# Patient Record
Sex: Female | Born: 1990 | Race: Black or African American | Hispanic: No | Marital: Single | Smoking: Never smoker
Health system: Southern US, Community
[De-identification: ages and names within clinical notes are randomized; demographics above are authoritative.]

## PROBLEM LIST (undated history)

## (undated) DIAGNOSIS — L309 Dermatitis, unspecified: Secondary | ICD-10-CM

## (undated) DIAGNOSIS — N3010 Interstitial cystitis (chronic) without hematuria: Secondary | ICD-10-CM

## (undated) HISTORY — DX: Interstitial cystitis (chronic) without hematuria: N30.10

## (undated) HISTORY — DX: Dermatitis, unspecified: L30.9

---
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
## 2009-04-05 ENCOUNTER — Ambulatory Visit: Payer: Self-pay | Admitting: Internal Medicine

## 2009-11-09 ENCOUNTER — Telehealth: Payer: Self-pay | Admitting: Internal Medicine

## 2010-05-14 NOTE — Progress Notes (Signed)
Summary: work in [employer] from Patient Call back at [phone]   Caller: Mom----walk in Summary of Call: needs cpx between 11-28-2009 and 11-29-2009 Initial call taken by: Warnell Forester,  November 09, 2009 5:01 PM  Follow-up for Phone Call        Okay to work in on 8/17 put in am. Can use [employer].  Follow-up by: Romualdo Bolk, CMA Duncan Dull),  November 09, 2009 5:17 PM  Additional Follow-up for Phone Call Additional follow up Details #1::        lmoam for mom to call back..made appt for 11-28-2009 at 11:15am for pt. Additional Follow-up by: Warnell Forester,  November 12, 2009 8:05 AM    Additional Follow-up for Phone Call Additional follow up Details #2::    lmoam-mom to return call to confirm appt. Follow-up by: Warnell Forester,  November 13, 2009 3:50 PM  Additional Follow-up for Phone Call Additional follow up Details #3:: Details for Additional Follow-up Action Taken: Mom called back to confirm appt.... adv her daughter will come in for cpx with Dr Fabian Sharp on 8/17 @ 11:15am.  Additional Follow-up by: Debbra Riding,  November 20, 2009 1:41 PM

## 2010-08-30 NOTE — Op Note (Signed)
Katelyn Hunter, Katelyn Hunter             ACCOUNT NO.:  [id number]   MEDICAL RECORD NO.:  [id number]          PATIENT TYPE:  AMB   LOCATION:  [facility]                         FACILITY:  [facility]   PHYSICIAN:  Maretta Bees. Vonita Moss, M.D.DATE OF BIRTH:  Apr 22, 1990   DATE OF PROCEDURE:  06/02/2005  DATE OF DISCHARGE:                                 OPERATIVE REPORT   PREOPERATIVE DIAGNOSIS:  Rule out interstitial cystitis.   POSTOPERATIVE DIAGNOSES:  1.  Rule out interstitial cystitis.  2.  Urethral stenosis.   PROCEDURE:  Cystoscopy, urethral dilation, hydraulic overdistention of  bladder and cold cup bladder biopsies.   SURGEON:  Dr. Larey Dresser.   ANESTHESIA:  General.   INDICATIONS:  This 20 year old lady has had very frequent urination and  lower abdominal and back pain over the last several weeks. She has had a CT  scan and a gynecologic workup. I was concerned about the diagnosis of IC.   DESCRIPTION OF PROCEDURE:  The patient was brought to the operating room,  placed in lithotomy position, external genitalia were prepped and draped in  the usual fashion. She was cystoscoped, but only after her urethra was  dilated to 26-French since the cystoscope did not initially pass easily. The  bladder was totally unremarkable and using the 70 degree lens and filling  her with sterile water at 300 mL and on the anterior wall the bladder mucosa  split. Therefore at 300 mL I stopped filling and emptied the bladder and  then looked back in and she had widespread submucosal petechiae and  hemorrhage in all four quadrants of the bladder typical of IC. Cold cup  bladder biopsies were obtained from the base in typical hemorrhagic areas  and the biopsy sites were fulgurated. I suspected she was  going to be uncomfortable afterwards so I inserted a combination of  Pyridium, Kenalog and Marcaine into the bladder. She was also given 15 mg of  Toradol IV. She was taken to the recovery room in good condition  having  tolerated the procedure well.      Maretta Bees. Vonita Moss, M.D.  Electronically Signed     LJP/MEDQ  D:  06/02/2005  T:  06/03/2005  Job:  [id number]   cc:   Neta Mends. Fabian Sharp, M.D. [facility]  [address] [place]  [place] [address]   M. Leda Quail, MD

## 2010-09-11 ENCOUNTER — Encounter: Payer: Self-pay | Admitting: Internal Medicine

## 2010-09-12 ENCOUNTER — Ambulatory Visit (INDEPENDENT_AMBULATORY_CARE_PROVIDER_SITE_OTHER): Payer: 59 | Admitting: Internal Medicine

## 2010-09-12 ENCOUNTER — Encounter: Payer: Self-pay | Admitting: Internal Medicine

## 2010-09-12 VITALS — BP 110/60 | HR 66 | Temp 99.0°F | Wt 121.0 lb

## 2010-09-12 DIAGNOSIS — Z464 Encounter for fitting and adjustment of orthodontic device: Secondary | ICD-10-CM

## 2010-09-12 DIAGNOSIS — H60399 Other infective otitis externa, unspecified ear: Secondary | ICD-10-CM

## 2010-09-12 DIAGNOSIS — R6884 Jaw pain: Secondary | ICD-10-CM | POA: Insufficient documentation

## 2010-09-12 DIAGNOSIS — H6090 Unspecified otitis externa, unspecified ear: Secondary | ICD-10-CM

## 2010-09-12 DIAGNOSIS — L259 Unspecified contact dermatitis, unspecified cause: Secondary | ICD-10-CM

## 2010-09-12 MED ORDER — KETOCONAZOLE 2 % EX CREA: TOPICAL_CREAM | Freq: Every day | CUTANEOUS | Status: DC

## 2010-09-12 MED ORDER — OFLOXACIN 0.3 % OT SOLN: 10.0000 [drp] | Freq: Every day | OTIC | Status: AC

## 2010-09-12 NOTE — Patient Instructions (Signed)
Use ear drops for external otitis. Also aleve 2 2 x per day for about a week or so or as needed for pain Expect you to feel better within a week . Call for advise if not getting  Better.

## 2010-09-12 NOTE — Progress Notes (Signed)
  Subjective:    Patient ID: Katelyn Hunter, female    DOB: [birth year], 20 y.o.   MRN: [id number]  HPI  Patient comes in today back from college .No major change in health status since last visit . Visit was over a year ago . Since than no major changes in health ..Using creams for skin rashes but doing ok.   Having headaches on the left and sore  Near ear like water in her ear and feels like jaw pain worse with touching the outside ear and  talking and eating pain.   Onset for 4-5 days . No fever  no history of swimming or water in the ear or Q-tips. Aleve some helps some but still hurts. No cold sx.  Some mild allergy sx.  No meds .  Ran out of skin cream left upper DC that she uses as needed for rashes and itching that seems to help. This appears to be the ketoconazole. She has used hydrocortisone also.  Last visit she's been generally well with no major injuries. No major changes in health. In school  [employer]. [employer]. Rising senior   Taking L sat  or sudden loss goal.   Review of Systems No fever. Has braces second time no recent manipulations no teeth grinding injury to the face or jaw.as per history of present illness  Past Medical History  Diagnosis Date  . Interstitial cystitis   . Allergic rhinitis   . Eczema    No past surgical history on file.  reports that she has never smoked. She does not have any smokeless tobacco history on file. Her alcohol and drug histories not on file. family history includes Depression in her father. No Known Allergies     Objective:   Physical Exam Well-developed well-nourished in no acute distress HEENT: Normocephalic ;atraumatic , Eyes;  PERRL, EOMs  Full, lids and conjunctiva clear,,Ears: no deformities, canals right nl left with white wet dc tm intact  Ead very tender but patent and minimal redness  Tender at tragus and alos at tmj area , TM landmarks normal, Nose: no deformity or discharge  Mouth : OP clear without  lesion or edema . Wearing braces Neck supple without masses Some tenderness at the left a.c. Area. Chest CTA BX equal Cardiac no gallops or murmurs regular rhythm  Skin diffuse blotchy areas on the arms face is clear Neuro grossly intact  Gait normal  Oriented x 3 and no noted deficits in memory, attention, and speech.       Assessment & Plan:  Left ear pain external otitis unclear cause. Discussed treatment antibiotic ear drops Aleve for pain She may also have an element of TMJ pain discussed treatment for that with relative tall rest and anti-inflammatories.  Skin: Unsure why the ketoconazole is working as needed for occasional rashes. She left her prescription up at [facility] we'll refill it locally. She'll be due for a checkup at the end of the summer she can call for an appointment and we'll work her in her school schedule.   Total visit > 50% spent counseling and coordinating care

## 2011-01-09 ENCOUNTER — Telehealth: Payer: Self-pay | Admitting: Internal Medicine

## 2011-01-09 NOTE — Telephone Encounter (Addendum)
Pt requesting refill on ketoconazole (NIZORAL) 2 % cream  Pt needs [facility] a [facility] in [place] but mother could not give me exact location or number. Please contact pt. please contact pt  At [phone]

## 2011-01-10 MED ORDER — KETOCONAZOLE 2 % EX CREA: TOPICAL_CREAM | Freq: Every day | CUTANEOUS | Status: DC

## 2011-01-10 NOTE — Telephone Encounter (Signed)
Left message for pt to call back  °

## 2011-11-17 ENCOUNTER — Other Ambulatory Visit: Payer: Self-pay | Admitting: Family Medicine

## 2011-11-17 ENCOUNTER — Other Ambulatory Visit: Payer: 59

## 2011-11-17 ENCOUNTER — Telehealth: Payer: Self-pay | Admitting: Internal Medicine

## 2011-11-17 DIAGNOSIS — Z0000 Encounter for general adult medical examination without abnormal findings: Secondary | ICD-10-CM

## 2011-11-17 NOTE — Telephone Encounter (Signed)
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 Pls advise where to sch.

## 2011-11-17 NOTE — Telephone Encounter (Signed)
Pt scheduled 11/19/11 @ 12:15 per WP.  Mom notified.  Will put in labs.  Pt will have lab work today.  Pryor Curia will work her in.

## 2011-11-18 ENCOUNTER — Other Ambulatory Visit: Payer: 59

## 2011-11-18 ENCOUNTER — Other Ambulatory Visit (INDEPENDENT_AMBULATORY_CARE_PROVIDER_SITE_OTHER): Payer: 59

## 2011-11-18 DIAGNOSIS — Z0000 Encounter for general adult medical examination without abnormal findings: Secondary | ICD-10-CM

## 2011-11-18 LAB — CBC WITH DIFFERENTIAL/PLATELET
Basophils Absolute: 0 10*3/uL (ref 0.0–0.1)
Basophils Relative: 0.4 % (ref 0.0–3.0)
Eosinophils Absolute: 0.3 10*3/uL (ref 0.0–0.7)
Eosinophils Relative: 6.6 % — ABNORMAL HIGH (ref 0.0–5.0)
HCT: 40 % (ref 36.0–46.0)
Hemoglobin: 13.6 g/dL (ref 12.0–15.0)
Lymphocytes Relative: 32.3 % (ref 12.0–46.0)
Lymphs Abs: 1.6 10*3/uL (ref 0.7–4.0)
MCHC: 34.1 g/dL (ref 30.0–36.0)
MCV: 86.4 fl (ref 78.0–100.0)
Monocytes Absolute: 0.5 10*3/uL (ref 0.1–1.0)
Monocytes Relative: 10.5 % (ref 3.0–12.0)
Neutro Abs: 2.6 10*3/uL (ref 1.4–7.7)
Neutrophils Relative %: 50.2 % (ref 43.0–77.0)
Platelets: 252 10*3/uL (ref 150.0–400.0)
RBC: 4.63 Mil/uL (ref 3.87–5.11)
RDW: 13.2 % (ref 11.5–14.6)
WBC: 5.1 10*3/uL (ref 4.5–10.5)

## 2011-11-18 LAB — POCT URINALYSIS DIPSTICK
Bilirubin, UA: NEGATIVE
Blood, UA: NEGATIVE
Glucose, UA: NEGATIVE
Ketones, UA: NEGATIVE
Nitrite, UA: NEGATIVE
Protein, UA: NEGATIVE
Spec Grav, UA: 1.025
Urobilinogen, UA: 0.2
pH, UA: 6

## 2011-11-18 LAB — LIPID PANEL
Cholesterol: 161 mg/dL (ref 0–200)
HDL: 70.9 mg/dL (ref >39.00)
LDL Cholesterol: 76 mg/dL (ref 0–99)
Total CHOL/HDL Ratio: 2
Triglycerides: 72 mg/dL (ref 0.0–149.0)
VLDL: 14.4 mg/dL (ref 0.0–40.0)

## 2011-11-18 LAB — TSH: TSH: 0.85 u[IU]/mL (ref 0.35–5.50)

## 2011-11-18 LAB — BASIC METABOLIC PANEL
BUN: 13 mg/dL (ref 6–23)
CO2: 24 mEq/L (ref 19–32)
Calcium: 9.4 mg/dL (ref 8.4–10.5)
Chloride: 99 mEq/L (ref 96–112)
Creatinine, Ser: 0.9 mg/dL (ref 0.4–1.2)
GFR: 107.41 mL/min (ref >60.00)
Glucose, Bld: 108 mg/dL — ABNORMAL HIGH (ref 70–99)
Potassium: 3.6 mEq/L (ref 3.5–5.1)
Sodium: 134 mEq/L — ABNORMAL LOW (ref 135–145)

## 2011-11-18 LAB — HEPATIC FUNCTION PANEL
ALT: 17 U/L (ref 0–35)
AST: 23 U/L (ref 0–37)
Albumin: 4.3 g/dL (ref 3.5–5.2)
Alkaline Phosphatase: 42 U/L (ref 39–117)
Bilirubin, Direct: 0 mg/dL (ref 0.0–0.3)
Total Bilirubin: 0.6 mg/dL (ref 0.3–1.2)
Total Protein: 7.5 g/dL (ref 6.0–8.3)

## 2011-11-19 ENCOUNTER — Encounter: Payer: Self-pay | Admitting: Internal Medicine

## 2011-11-19 ENCOUNTER — Other Ambulatory Visit (HOSPITAL_COMMUNITY)
Admission: RE | Admit: 2011-11-19 | Discharge: 2011-11-19 | Disposition: A | Discharge status: Home / Self Care | Payer: 59 | Source: Ambulatory Visit | Attending: Internal Medicine | Admitting: Internal Medicine

## 2011-11-19 ENCOUNTER — Ambulatory Visit (INDEPENDENT_AMBULATORY_CARE_PROVIDER_SITE_OTHER): Payer: 59 | Admitting: Internal Medicine

## 2011-11-19 VITALS — BP 120/72 | HR 102 | Temp 97.6°F | Ht 62.0 in | Wt 118.0 lb

## 2011-11-19 DIAGNOSIS — Z01419 Encounter for gynecological examination (general) (routine) without abnormal findings: Secondary | ICD-10-CM | POA: Insufficient documentation

## 2011-11-19 DIAGNOSIS — L309 Dermatitis, unspecified: Secondary | ICD-10-CM

## 2011-11-19 DIAGNOSIS — Z0000 Encounter for general adult medical examination without abnormal findings: Secondary | ICD-10-CM

## 2011-11-19 DIAGNOSIS — L259 Unspecified contact dermatitis, unspecified cause: Secondary | ICD-10-CM

## 2011-11-19 DIAGNOSIS — Z113 Encounter for screening for infections with a predominantly sexual mode of transmission: Secondary | ICD-10-CM | POA: Insufficient documentation

## 2011-11-19 DIAGNOSIS — N3010 Interstitial cystitis (chronic) without hematuria: Secondary | ICD-10-CM

## 2011-11-19 DIAGNOSIS — M25532 Pain in left wrist: Secondary | ICD-10-CM | POA: Insufficient documentation

## 2011-11-19 NOTE — Addendum Note (Signed)
Addended by: Raj Janus on: 11/19/2011 02:04 PM   Modules accepted: Orders

## 2011-11-19 NOTE — Patient Instructions (Addendum)
Continue lifestyle diet healthy eating and exercise . Sleep is important for health. Your labs are good  The blood sugar is slightly up because you were not fasting and not concerned about this. Will notify you  of PAP  when available.

## 2011-11-19 NOTE — Progress Notes (Signed)
Subjective:    Patient ID: Katelyn Hunter, female    DOB: 1990/05/30, 21 y.o.   MRN: [id number]  HPI Patient comes in today for preventive wellness visit. Since her last visit she has done well graduated from college and is going to attend law school in [place]. She's had no major changes in her health has recently had some mosquito bites check a few things to go to the dermatologist treated for eczema with the cream and changed her soap is doing well. In regard to her interstitial cystitis she gets an occasional flare that goes away with time no unusual infections or abdominal pain.  Review of Systems ROS:  GEN/ HEENT: No fever, significant weight changes sweats headaches vision problems hearing changes, CV/ PULM; No chest pain shortness of breath cough, syncope,edema  change in exercise tolerance. GI /GU: No adominal pain, vomiting, change in bowel habits. No blood in the stool. No significant GU symptoms. Periods are normal last SA 3 months ago condoms SKIN/HEME: ,no acute skin rashes suspicious lesions or bleeding. No lymphadenopathy, , masses. Has a bump on her left for had that it and some tenderness in the left TMJ area NEURO/ PSYCH:  No neurologic signs such as weakness numbness. No depression anxiety. IMM/ Allergy: No unusual infections.  Allergy .   REST of 12 system review negative except as per HPI Outpatient Encounter Prescriptions as of 11/19/2011  Medication Sig Dispense Refill  . DISCONTD: ketoconazole (NIZORAL) 2 % cream Apply topically daily.  15 g  1  . DISCONTD: tacrolimus (PROTOPIC) 0.03 % ointment Apply topically 2 (two) times daily.         Past history family history social history reviewed in the electronic medical record.     Objective:   Physical Exam BP 120/72  Pulse 102  Temp 97.6 F (36.4 C) (Oral)  Ht 5\' 2"  (1.575 m)  Wt 118 lb (53.524 kg)  BMI 21.58 kg/m2  SpO2 99%  LMP 10/31/2011 Physical Exam: Vital signs reviewed FAO:ZHYQ is a well-developed  well-nourished alert cooperative aa  female who appears her stated age in no acute distress.  HEENT: normocephalic atraumatic , Eyes: PERRL EOM's full, conjunctiva clear, Nares: paten,t no deformity discharge or tenderness., Ears: no deformity EAC's clear TMs with normal landmarks. Mouth: clear OP, no lesions, edema.  Moist mucous membranes. Dentition in adequate repair. Left preauricular area slight TMJ tenderness NECK: supple without masses, thyromegaly or bruits. CHEST/PULM:  Clear to auscultation and percussion breath sounds equal no wheeze , rales or rhonchi. No chest wall deformities or tenderness. Breast: normal by inspection . No dimpling, discharge, masses, tenderness or discharge . CV: PMI is nondisplaced, S1 S2 no gallops, murmurs, rubs. Peripheral pulses are full without delay.No JVD .  Pulse 88 and regular  ABDOMEN: Bowel sounds normal nontender  No guard or rebound, no hepato splenomegal no CVA tenderness.  No hernia. Extremtities:  No clubbing cyanosis or edema, no acute joint swelling or redness no focal atrophy NEURO:  Oriented x3, cranial nerves 3-12 appear to be intact, no obvious focal weakness,gait within normal limits no abnormal reflexes or asymmetrical SKIN: No acute rashes normal turgor, color, no bruising or petechiae. Left for head there is a small less than pea-sized cystic soft feeling area no redness no fluctuance possible cyst versus bug bite PSYCH: Oriented, good eye contact, no obvious depression anxiety, cognition and judgment appear normal. LN: no cervical axillary inguinal adenopathy Pelvic: NL ext GU, labia clear without lesions or rash .  Vagina no lesions scant white discharge mild hyperemia .Cervix: clear  UTERUS: Neg CMT Adnexa:  clear no masses . PAP done  Lab Results  Component Value Date   WBC 5.1 11/17/2011   HGB 13.6 11/17/2011   HCT 40.0 11/17/2011   PLT 252.0 11/17/2011   GLUCOSE 108* 11/17/2011   CHOL 161 11/17/2011   TRIG 72.0 11/17/2011   HDL 70.90 11/17/2011     LDLCALC 76 11/17/2011   ALT 17 11/17/2011   AST 23 11/17/2011   NA 134* 11/17/2011   K 3.6 11/17/2011   CL 99 11/17/2011   CREATININE 0.9 11/17/2011   BUN 13 11/17/2011   CO2 24 11/17/2011   TSH 0.85 11/17/2011   Above laboratory tests were not fasting     Assessment & Plan:   Preventive Health Care Counseled regarding healthy nutrition, exercise, sleep, injury prevention, calcium vit d and healthy weight . Gyne exam normal . Pap gc chl screen to be done off pap.  Eczema Hx of IC  Counseled.

## 2011-11-27 NOTE — Progress Notes (Signed)
Quick Note:  Tell patient PAP is normal. Chlamydia screen negative. ______ 

## 2011-11-28 ENCOUNTER — Encounter: Payer: Self-pay | Admitting: Family Medicine

## 2012-07-14 ENCOUNTER — Telehealth: Payer: Self-pay | Admitting: Internal Medicine

## 2012-07-14 NOTE — Telephone Encounter (Signed)
Pt is travelling to [place] from Sep 11, 2012 until 10-18-2012 Pt is requesting malaria pills to take [facility] her. [facility] [place]

## 2012-07-16 NOTE — Telephone Encounter (Signed)
There are  A number of options  For prophylaxis  daily weekly etc  depending on   Area of travel .  Also  needs to  Update  Other immunizations for travel such as tdap ( due in May)    Advise OV to decide and rx rx .  Also check the cdc travel web site to look for other  Advice

## 2012-07-16 NOTE — Telephone Encounter (Signed)
Please call and make an appt to be seen in the office by [facility].  Thanks!!

## 2012-07-19 NOTE — Telephone Encounter (Signed)
Pt is sch for 5-19

## 2012-08-30 ENCOUNTER — Ambulatory Visit: Payer: 59 | Admitting: Internal Medicine

## 2012-08-31 ENCOUNTER — Telehealth: Payer: Self-pay | Admitting: Internal Medicine

## 2012-08-31 NOTE — Telephone Encounter (Signed)
PT mother called and stated that she is unable to get home from college by Friday 5/23. She will need to reschedule that appt for 5/27 or 5/28. There are no available fu slots, please advise as to where you would like this rescheduled to. Thank you!

## 2012-09-01 NOTE — Telephone Encounter (Signed)
Ok to rescedule  This for either of these days

## 2012-09-02 ENCOUNTER — Telehealth: Payer: Self-pay | Admitting: Internal Medicine

## 2012-09-02 NOTE — Telephone Encounter (Signed)
Patient calls with questions about travelers shots.  Is going to [place] to study for 40 days.  Is leaving next Friday 09/10/12.  Is to get travel shots on 09/07/12 when comes to appointment.  RN pulled up patients [facility] and notes that she has completed her Hepatitis B series, no record of Hepatitis A.  Unless guidelines have changed for boosters she may not need a Hep B booster.  RN notes that she will only get 1 Hep A shot prior to travel and will not be fully immunized.  Patient had questions about the Typhoid immunizations-using CDC guidelines for travels reviewed the options from injectable  vaccine and  Oral vaccine, noting when the vaccines are fully in affect and to take precautions in where she eats until her she is fully protected.  According to CDC Malaria are not in the areas she is scheduled to visit, but she will discuss this on her visit. She also plans to get Td booster at this time.  No other questions not and caller demonstrated her understanding.  Source of all information given to patient was from  [employer]' health and [facility].

## 2012-09-03 ENCOUNTER — Ambulatory Visit: Payer: 59 | Admitting: Internal Medicine

## 2012-09-03 NOTE — Telephone Encounter (Signed)
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  She will get rx for the malaria when she sees Dr Fabian Sharp

## 2012-09-07 ENCOUNTER — Ambulatory Visit (INDEPENDENT_AMBULATORY_CARE_PROVIDER_SITE_OTHER): Payer: BC Managed Care – PPO | Admitting: Internal Medicine

## 2012-09-07 ENCOUNTER — Encounter: Payer: Self-pay | Admitting: Internal Medicine

## 2012-09-07 ENCOUNTER — Ambulatory Visit: Payer: 59 | Admitting: Family Medicine

## 2012-09-07 VITALS — BP 120/64 | HR 84 | Temp 98.2°F | Wt 115.0 lb

## 2012-09-07 DIAGNOSIS — B360 Pityriasis versicolor: Secondary | ICD-10-CM

## 2012-09-07 DIAGNOSIS — Z7184 Encounter for health counseling related to travel: Secondary | ICD-10-CM

## 2012-09-07 DIAGNOSIS — Z23 Encounter for immunization: Secondary | ICD-10-CM

## 2012-09-07 DIAGNOSIS — L309 Dermatitis, unspecified: Secondary | ICD-10-CM

## 2012-09-07 DIAGNOSIS — L259 Unspecified contact dermatitis, unspecified cause: Secondary | ICD-10-CM

## 2012-09-07 MED ORDER — DESONIDE 0.05 % EX CREA: TOPICAL_CREAM | Freq: Two times a day (BID) | CUTANEOUS | Status: AC

## 2012-09-07 MED ORDER — CIPROFLOXACIN HCL 500 MG PO TABS: 500.0000 mg | ORAL_TABLET | Freq: Two times a day (BID) | ORAL | Status: AC

## 2012-09-07 MED ORDER — MEFLOQUINE HCL 250 MG PO TABS: 250.0000 mg | ORAL_TABLET | ORAL | Status: AC

## 2012-09-07 MED ORDER — KETOCONAZOLE 2 % EX SHAM: MEDICATED_SHAMPOO | CUTANEOUS | Status: AC

## 2012-09-07 NOTE — Patient Instructions (Addendum)
You may be treaveling to alow risk are of malaria but  Weekly med sent in must begin right away.  Any of these meds could interfere with typhoid oral vaccine.  Have a good trip .   Sign up for My chart to see you labs and visits and accesss to e mail messaging.    Tinea Versicolor Tinea versicolor is a common yeast infection of the skin. This condition becomes known when the yeast on our skin starts to overgrow (yeast is a normal inhabitant on our skin). This condition is noticed as white or light brown patches on brown skin, and is more evident in the summer on tanned skin. These areas are slightly scaly if scratched. The light patches from the yeast become evident when the yeast creates "holes in your suntan". This is most often noticed in the summer. The patches are usually located on the chest, back, pubis, neck and body folds. However, it may occur on any area of body. Mild itching and inflammation (redness or soreness) may be present. DIAGNOSIS  The diagnosisof this is made clinically (by looking). Cultures from samples are usually not needed. Examination under the microscope may help. However, yeast is normally found on skin. The diagnosis still remains clinical. Examination under Wood's Ultraviolet Light can determine the extent of the infection. TREATMENT  This common infection is usually only of cosmetic (only a concern to your appearance). It is easily treated with dandruff shampoo used during showers or bathing. Vigorous scrubbing will eliminate the yeast over several days time. The light areas in your skin may remain for weeks or months after the infection is cured unless your skin is exposed to sunlight. The lighter or darker spots caused by the fungus that remain after complete treatment are not a sign of treatment failure; it will take a long time to resolve. Your caregiver may recommend a number of commercial preparations or medication by mouth if home care is not working. Recurrence  is common and preventative medication may be necessary. This skin condition is not highly contagious. Special care is not needed to protect close friends and family members. Normal hygiene is usually enough. Follow up is required only if you develop complications (such as a secondary infection from scratching), if recommended by your caregiver, or if no relief is obtained from the preparations used. Document Released: 03/28/2000 Document Revised: 06/23/2011 Document Reviewed: 05/10/2008 [facility] Patient Information 2014 [place], [place]. Travelers' Diarrhea Travelers' diarrhea (TD) is the most common illness affecting travelers. Each year many travelers develop diarrhea. TD usually occurs within the first week of travel. However, it may occur at any time while traveling. It may even occur after returning home. The most important risk factor is where you are going. High-risk places are the developing countries of:  [place].  [place].  [place].  [place]. High risk people include young adults and those with:  Transplants.  HIV infections.  Medicine that suppresses the immune system.  Inflammatory-bowel disease.  Diabetes.  H-2 blockers or antacids. Attack rates are similar for men and women. The primary source of TD is eating or drinking food or water tainted with feces (stool or bowel movements). CAUSES  Infectious agents are the primary cause of TD. Germs cause almost 80% of TD cases. The most common germ produces:  Watery diarrhea with cramps.  Low-grade or no fever. There are many other bacterial, viral and parasitic pathogens (disease causing "bugs").  SYMPTOMS  Most TD cases begin suddenly. Symptoms include stool  that is increased in:  Frequency.  Volume.  Weight. Altered stool consistency also is common. Typically, you have four to five loose or watery bowel movements each day. Other common symptoms are:  Nausea.  Vomiting.  Diarrhea.  Abdominal  cramping.  Bloating.  Fever.  Urgency.  Malaise. Most cases are not dangerous. Most cases go away in 1-2 days without treatment. TD is rarely life threatening. 90% of cases resolve within 1 week. 98% resolve within 1 month. PREVENTION   Avoid foods or beverages purchased from street vendors in high risk countries.  Avoid food from places where unclean conditions are present.  Avoid raw or undercooked meat and seafood.  Avoid raw fruits (e.g., oranges, bananas, avocados) and vegetables unless you peel them yourself.  If handled properly, well-cooked and packaged foods usually are safe. Foods associated with increased risk for TD include:  Tap water.  Ice.  Unpasteurized milk.  Dairy products.  Safe beverages include:  Bottled carbonated beverages.  Hot tea or coffee.  Beer.  Wine.  Boiled water.  Water treated with iodine or chlorine. ANTIBIOTICS ARE NOT RECOMMENDED AS PREVENTION  CDC (Centers for Disease Control) does not recommend antimicrobial drugs (medicine that kill germs) to prevent TD. Several studies show that Pepto-Bismol taken as either 2 tablets 4 times daily, or 2 fluid ounces 4 times daily, reduces the incidence of travelers' diarrhea. People that should avoid Pepto-Bismol include those who are:  Pregnant.  Allergic to aspirin.  Taking anticoagulants medicine (probenecid, methotrexate).  Be informed about potential side effects, in particular about temporary blackening of the tongue and stool, and rarely ringing in the ears. Because of potential adverse side effects, preventative Pepto-Bismol should not be used for more than 3 weeks.  Some antibiotics taken in a once-a-day dose are 90% effective at preventing travelers' diarrhea. However, antibiotics are not recommended as prevention. Routine antimicrobial prophylaxis increases your risk for:  Adverse reactions.  Infections with resistant organisms.  Antibiotics can increase your  susceptibility to resistant bacterial pathogens and provide no protection against either viral or parasitic pathogens. This can give travelers a false sense of security. As a result, strict adherence to preventive measures is encouraged. Pepto-Bismol should be used as an extra effort if prophylaxis is needed. TREATMENT   TD usually is a self-limited disorder. It gets well without treatment. It often goes away without specific treatment. Oral re-hydration is often helpful to replace lost fluids and electrolytes. Clear liquids are routinely recommended for adults. You may be helped with antimicrobial therapy if you develop three or more loose stools in an 8-hour period, especially if associated with:  Nausea.  Vomiting.  Abdominal cramps.  Fever.  Blood in stools.  Antibiotics usually are given for 3-5 days. Pepto-Bismol also may be used as treatment. Take one fluid ounce, or two 262 mg tablets every 30 minutes, for up to 8 doses in a 24-hour period. This can be repeated on a second day. If diarrhea persists despite therapy, you should be evaluated by a caregiver and treated for possible parasitic infection.  Because drug resistance is a continuing problem and may vary from country to country, professional assistance should be looked for if problems persist.  Antimotility agents (loperamide, diphenoxylate, and paregoric) mostly reduce diarrhea by slowing down the passage of food and drink in the gut. This allows more time for absorption. Some persons believe diarrhea is the body's defense mechanism to minimize contact time between gut pathogens and lining of the bowel. In several studies,  antimotility agents have been useful in treating travelers' diarrhea by decreasing the duration of diarrhea. However, these agents should never be used by persons with fever or bloody diarrhea because they can increase the severity of disease by delaying clearance of causative organisms. Because antimotility  agents are now available over the counter, their improper use is of concern. Complications have been reported from the use of these medicines such as:  Toxic megacolon.  Sepsis.  Disseminated intravascular coagulation. SEEK IMMEDIATE MEDICAL CARE IF:   You are unable to keep fluids down.  Vomiting or diarrhea becomes persistent.  Abdominal (belly) pain develops or increases or localizes. (Right sided pain can be appendicitis and left sided pain in adults can be diverticulitis).  You develop an oral temperature above 102 F (38.9 C), or as your caregiver suggests.  Diarrhea becomes excessive or contains blood or mucous.  Excessive weakness, dizziness, fainting or extreme thirst.  Checking weight 2 to 3 times per day in babies and children will help verify adequate fluid replacement. Your caregiver will tell you what loss should concern you or suggest another visit to your personal physician.  Record your weight or your child's weight today. Compare this to your home scale and record all weights and time and date weighed. Try to check weight at the same times every day. Bring this chart to your caregivers if you or your child needs to be seen again. FOR MORE INFORMATION  Travelers should consult with a caregiver before departing on a trip abroad. Information about TD is available from:  Your local or state health departments.  World Science writer [facility]). Other information that may be of interest to travelers can be found at the [facility] Travelers' Health homepage at [email or web address]. Document Released: 03/21/2002 Document Revised: 06/23/2011 Document Reviewed: 06/08/2008 [facility] Patient Information 2014 [place], [place].

## 2012-09-07 NOTE — Progress Notes (Signed)
Chief Complaint  Patient presents with  . Immunizations    Needs her Hep A and tdap immunizations.  Also wants to discuss medication for malaria.  Would like a refill of Nizoral.    HPI: Patient comes in today for evaluations for  Travel  And preventive measures.  To travel    [place].      For 40 days   Study abroad   [employer].    i scurrently on thyroid vaccine and interested in malaria prophylaxis as optional but many others taking it.  Hx of eczema  Legs and arms.   Asks for med in case needs it . Also something if needed for travelers diarrhea.  Has hx of rash on back given Nizoral in past    Hx of interstitial cystitis  Not a lot of flares ROS:  GEN/ HEENT: No fever, significant weight changes sweats headaches vision problems hearing changes, CV/ PULM; No chest pain shortness of breath cough, syncope,edema  change in exercise tolerance. GI /GU: No adominal pain, vomiting, change in bowel habits. No blood in the stool. No significant GU symptoms. SKIN/HEME: ,no acute skin rashes suspicious lesions or bleeding. No lymphadenopathy, nodules, masses.  NEURO/ PSYCH:  No neurologic signs such as weakness numbness. No depression anxiety. IMM/ Allergy: No unusual infections.  Allergy .   REST of 12 system review negative except as per HPI   Past Medical History  Diagnosis Date  . Interstitial cystitis   . Allergic rhinitis   . Eczema     Family History  Problem Relation Age of Onset  . Depression Father     History   Social History  . Marital Status: Single    Spouse Name: N/A    Number of Children: N/A  . Years of Education: N/A   Social History Main Topics  . Smoking status: Never Smoker   . Smokeless tobacco: None  . Alcohol Use: None  . Drug Use: None  . Sexually Active: None   Other Topics Concern  . 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 typhoid (VIVOTIF) DR capsule Take 1 capsule by mouth every other day.      . ciprofloxacin (CIPRO) 500 MG tablet Take 1 tablet (500 mg total) by mouth 2 (two) times daily. for travelers diarrhea  6 tablet  0  . desonide (DESOWEN) 0.05 % cream Apply topically 2 (two) times daily. For eczema  30 g  1  . ketoconazole (NIZORAL) 2 % shampoo Apply to affected area  For 5 minutes rinse ; repeat for 3 days or as directed  120 mL  2  . mefloquine (LARIAM) 250 MG tablet Take 1 tablet (250 mg total) by mouth every 7 (seven) days. Begin 2-3 weeks pre travel and continue 4 weeks after travel.  12 tablet  0   No facility-administered encounter medications on file as of 09/07/2012.    EXAM:  BP 120/64  Pulse 84  Temp(Src) 98.2 F (36.8 C) (Oral)  Wt 115 lb (52.164 kg)  BMI 21.03 kg/m2  SpO2 98%  LMP 09/02/2012  Body mass index is 21.03  kg/(m^2).  Physical Exam: Vital signs reviewed [id number] is a well-developed well-nourished alert cooperative   female who appears her stated age in no acute distress.  HEENT: normocephalic atraumatic , Eyes: PERRL EOM's full, conjunctiva clear, Nares: paten,t no deformity discharge or tenderness., Ears: no deformity EAC's clear TMs with normal landmarks.  NECK: supple without masses, thyromegaly or bruits. CHEST/PULM:  Clear to auscultation and percussion breath sounds equal no wheeze , rales or rhonchi. No chest wall deformities or tenderness. CV: PMI is nondisplaced, S1 S2 no gallops, murmurs, rubs. Peripheral pulses are full without delay ABDOMEN: Bowel sounds normal nontender  No guard or rebound, no hepato splenomegal no CVA tenderness.  Extremtities:  No clubbing cyanosis or edema, no acute joint swelling or redness no focal atrophy NEURO:  Oriented x3, cranial nerves 3-12  appear to be intact, no obvious focal weakness,gait within normal limits no abnormal reflexes or asymmetrical SKIN: No acute rashes normal turgor, color, no bruising or petechiae.  TV type rash mid lower back   Som quiescent patches eczema  Pigment change  PSYCH: Oriented, good eye contact, no obvious depression anxiety, cognition and judgment appear normal. LN: no cervical adenopathy  Lab Results  Component Value Date   WBC 5.1 11/17/2011   HGB 13.6 11/17/2011   HCT 40.0 11/17/2011   PLT 252.0 11/17/2011   GLUCOSE 108* 11/17/2011   CHOL 161 11/17/2011   TRIG 72.0 11/17/2011   HDL 70.90 11/17/2011   LDLCALC 76 11/17/2011   ALT 17 11/17/2011   AST 23 11/17/2011   NA 134* 11/17/2011   K 3.6 11/17/2011   CL 99 11/17/2011   CREATININE 0.9 11/17/2011   BUN 13 11/17/2011   CO2 24 11/17/2011   TSH 0.85 11/17/2011    ASSESSMENT AND PLAN:  Discussed the following assessment and plan:  Need for Tdap vaccination - Plan: Tdap vaccine greater than or equal to 7yo IM  Eczema - refill med if needed  Counseling about travel - risk benefit for various malaria prevention a bit late for weekley but can start   poss IA with  thyphoid vaccine  ok for hep a  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  Any of these meds could interfere with typhoid oral vaccine.  Have a good trip .   Sign up for My chart to see you labs and visits and accesss to e mail messaging.    Tinea Versicolor Tinea versicolor is a common yeast infection of the skin. This condition becomes known when the yeast  on our skin starts to overgrow (yeast is a normal inhabitant on our skin). This condition is noticed as white or light brown patches on brown skin, and is more evident in the summer on tanned skin. These areas are slightly scaly if scratched. The light patches from the yeast become evident when the yeast creates "holes in your suntan". This is most often noticed in the summer. The patches are usually located on the chest, back, pubis, neck and body folds.  However, it may occur on any area of body. Mild itching and inflammation (redness or soreness) may be present. DIAGNOSIS  The diagnosisof this is made clinically (by looking). Cultures from samples are usually not needed. Examination under the microscope may help. However, yeast is normally found on skin. The diagnosis still remains clinical. Examination under Wood's Ultraviolet Light can determine the extent of the infection. TREATMENT  This common infection is usually only of cosmetic (only a concern to your appearance). It is easily treated with dandruff shampoo used during showers or bathing. Vigorous scrubbing will eliminate the yeast over several days time. The light areas in your skin may remain for weeks or months after the infection is cured unless your skin is exposed to sunlight. The lighter or darker spots caused by the fungus that remain after complete treatment are not a sign of treatment failure; it will take a long time to resolve. Your caregiver may recommend a number of commercial preparations or medication by mouth if home care is not working. Recurrence is common and preventative medication may be necessary. This skin condition is not highly contagious. Special care is not needed to protect close friends and family members. Normal hygiene is usually enough. Follow up is required only if you develop complications (such as a secondary infection from scratching), if recommended by your caregiver, or if no relief is obtained from the  preparations used. Document Released: 03/28/2000 Document Revised: 06/23/2011 Document Reviewed: 05/10/2008 [facility] Patient Information 2014 [place], [place]. Travelers' Diarrhea Travelers' diarrhea (TD) is the most common illness affecting travelers. Each year many travelers develop diarrhea. TD usually occurs within the first week of travel. However, it may occur at any time while traveling. It may even occur after returning home. The most important risk factor is where you are going. High-risk places are the developing countries of:  [place].  [place].  [place].  [place]. High risk people include young adults and those with:  Transplants.  HIV infections.  Medicine that suppresses the immune system.  Inflammatory-bowel disease.  Diabetes.  H-2 blockers or antacids. Attack rates are similar for men and women. The primary source of TD is eating or drinking food or water tainted with feces (stool or bowel movements). CAUSES  Infectious agents are the primary cause of TD. Germs cause almost 80% of TD cases. The most common germ produces:  Watery diarrhea with cramps.  Low-grade or no fever. There are many other bacterial, viral and parasitic pathogens (disease causing "bugs").  SYMPTOMS  Most TD cases begin suddenly. Symptoms include stool that is increased in:  Frequency.  Volume.  Weight. Altered stool consistency also is common. Typically, you have four to five loose or watery bowel movements each day. Other common symptoms are:  Nausea.  Vomiting.  Diarrhea.  Abdominal cramping.  Bloating.  Fever.  Urgency.  Malaise. Most cases are not dangerous. Most cases go away in 1-2 days without treatment. TD is rarely life threatening. 90% of cases resolve within 1 week. 98% resolve within 1 month. PREVENTION   Avoid foods or beverages purchased from street vendors in high risk countries.  Avoid food from places where unclean conditions are  present.  Avoid raw or undercooked meat and seafood.  Avoid raw fruits (e.g., oranges, bananas, avocados) and vegetables unless you peel them yourself.  If handled properly, well-cooked and packaged foods usually are safe. Foods associated with increased risk for TD include:  Tap water.  Ice.  Unpasteurized milk.  Dairy products.  Safe beverages include:  Bottled carbonated beverages.  Hot tea or coffee.  Beer.  Wine.  Boiled water.  Water treated with iodine or chlorine. ANTIBIOTICS ARE NOT RECOMMENDED AS PREVENTION  CDC (Centers for Disease Control) does not recommend antimicrobial drugs (medicine that kill germs) to prevent TD. Several studies show that Pepto-Bismol taken as either 2 tablets 4 times daily, or 2 fluid ounces 4 times daily, reduces the incidence of travelers' diarrhea. People that should avoid Pepto-Bismol include those who are:  Pregnant.  Allergic to aspirin.  Taking anticoagulants medicine (probenecid, methotrexate).  Be informed about potential side effects, in particular about temporary blackening of the tongue and stool, and rarely ringing in the ears. Because of potential adverse side effects, preventative Pepto-Bismol should not be used for more than 3 weeks.  Some antibiotics taken in a once-a-day dose are 90% effective at preventing travelers' diarrhea. However, antibiotics are not recommended as prevention. Routine antimicrobial prophylaxis increases your risk for:  Adverse reactions.  Infections with resistant organisms.  Antibiotics can increase your susceptibility to resistant bacterial pathogens and provide no protection against either viral or parasitic pathogens. This can give travelers a false sense of security. As a result, strict adherence to preventive measures is encouraged. Pepto-Bismol should be used as an extra effort if prophylaxis is needed. TREATMENT   TD usually is a self-limited disorder. It gets well without  treatment. It often goes away without specific treatment. Oral re-hydration is often helpful to replace lost fluids and electrolytes. Clear liquids are routinely recommended for adults. You may be helped with antimicrobial therapy if you develop three or more loose stools in an 8-hour period, especially if associated with:  Nausea.  Vomiting.  Abdominal cramps.  Fever.  Blood in stools.  Antibiotics usually are given for 3-5 days. Pepto-Bismol also may be used as treatment. Take one fluid ounce, or two 262 mg tablets every 30 minutes, for up to 8 doses in a 24-hour period. This can be repeated on a second day. If diarrhea persists despite therapy, you should be evaluated by a caregiver and treated for possible parasitic infection.  Because drug resistance is a continuing problem and may vary from country to country, professional assistance should be looked for if problems persist.  Antimotility agents (loperamide, diphenoxylate, and paregoric) mostly reduce diarrhea by slowing down the passage of food and drink in the gut. This allows more time for absorption. Some persons believe diarrhea is the body's defense mechanism to minimize contact time between gut pathogens and lining of the bowel. In several studies, antimotility agents have been useful in treating travelers' diarrhea by decreasing the duration of diarrhea. However, these agents should never be used by persons with fever or bloody diarrhea because they can increase the severity of disease by delaying clearance of causative organisms. Because antimotility agents are now available over the counter, their improper use is of concern. Complications have been reported from the use of these medicines such as:  Toxic megacolon.  Sepsis.  Disseminated intravascular coagulation. SEEK IMMEDIATE MEDICAL CARE IF:   You are unable to keep fluids down.  Vomiting or diarrhea becomes persistent.  Abdominal (belly) pain develops or increases  or localizes. (Right sided pain can be appendicitis and left sided pain in adults can be diverticulitis).  You develop an oral temperature above 102 F (38.9 C), or as your caregiver suggests.  Diarrhea becomes excessive or contains blood or mucous.  Excessive weakness, dizziness, fainting or extreme thirst.  Checking weight 2  to 3 times per day in babies and children will help verify adequate fluid replacement. Your caregiver will tell you what loss should concern you or suggest another visit to your personal physician.  Record your weight or your child's weight today. Compare this to your home scale and record all weights and time and date weighed. Try to check weight at the same times every day. Bring this chart to your caregivers if you or your child needs to be seen again. FOR MORE INFORMATION  Travelers should consult with a caregiver before departing on a trip abroad. Information about TD is available from:  Your local or state health departments.  World Science writer [facility]). Other information that may be of interest to travelers can be found at the [facility] homepage at QuestDrive.[email or web address]. Document Released: 03/21/2002 Document Revised: 06/23/2011 Document Reviewed: 06/08/2008 [facility] Patient Information 2014 [place], [place].     Neta Mends. Panosh M.D.

## 2012-09-09 ENCOUNTER — Encounter: Payer: Self-pay | Admitting: Internal Medicine

## 2012-09-09 DIAGNOSIS — L309 Dermatitis, unspecified: Secondary | ICD-10-CM | POA: Insufficient documentation

## 2012-09-09 DIAGNOSIS — Z7184 Encounter for health counseling related to travel: Secondary | ICD-10-CM | POA: Insufficient documentation

## 2012-09-09 DIAGNOSIS — B360 Pityriasis versicolor: Secondary | ICD-10-CM | POA: Insufficient documentation

## 2014-04-03 ENCOUNTER — Telehealth: Payer: Self-pay | Admitting: Internal Medicine

## 2014-04-03 NOTE — Telephone Encounter (Signed)
No CPX slot available.  Pt may schedule at another time.  Perhaps when she is in from school at another time.

## 2014-04-03 NOTE — Telephone Encounter (Signed)
Mom states pt is home for the holidays. Pt wants to know if she can have a cpe next Monday. Advised mom no appt, but she would like you to call her and see if you can work something out for her

## 2014-04-03 NOTE — Telephone Encounter (Signed)
Mom aware no cpe slots available . 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
